# Patient Record
Sex: Male | Born: 1995 | Hispanic: Yes | Marital: Single | Smoking: Current some day smoker
Health system: Southern US, Community
[De-identification: ages and names within clinical notes are randomized; demographics above are authoritative.]

---
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
## 2017-03-12 ENCOUNTER — Ambulatory Visit (INDEPENDENT_AMBULATORY_CARE_PROVIDER_SITE_OTHER): Payer: BLUE CROSS/BLUE SHIELD | Admitting: Physician Assistant

## 2017-03-12 VITALS — BP 132/72 | HR 102 | Temp 101.7°F | Resp 16 | Ht 68.0 in | Wt 207.0 lb

## 2017-03-12 DIAGNOSIS — J029 Acute pharyngitis, unspecified: Secondary | ICD-10-CM | POA: Diagnosis not present

## 2017-03-12 LAB — POCT RAPID STREP A (OFFICE): RAPID STREP A SCREEN: NEGATIVE

## 2017-03-12 MED ORDER — AMOXICILLIN-POT CLAVULANATE 875-125 MG PO TABS: 1.0000 | ORAL_TABLET | Freq: Two times a day (BID) | ORAL | 0 refills | Status: AC

## 2017-03-12 NOTE — Progress Notes (Signed)
PRIMARY CARE AT [facility] [address], [place] [address] [phone]  Date:  03/12/2017   Name:  Jorge Keith   DOB:  03/18/1996   MRN:  [id number]  PCP:  System, Pcp Not In    History of Present Illness:  Jorge Keith is a 21 y.o. male patient who presents to PCP with  Chief Complaint  Patient presents with  . Sore Throat    x 1 day  . Back Pain    x 1 day  . Fever    101.7     He has throat pain this morning.  Yesterday, he had sore gums and lower back.   Last ibuprofen 11am.   He has no coughing at this time.   No nasal congestion.   No urinary pain, nausea, hematuria, or frequency.    There are no active problems to display for this patient.   No past medical history on file.  No past surgical history on file.  Social History  Substance Use Topics  . Smoking status: Passive Smoke Exposure - Never Smoker  . Smokeless tobacco: Not on file  . Alcohol use Not on file    No family history on file.  No Known Allergies  Medication list has been reviewed and updated.  No current outpatient prescriptions on file prior to visit.   No current facility-administered medications on file prior to visit.     ROS ROS otherwise unremarkable unless listed above.  Physical Examination: BP 132/72   Pulse (!) 102   Temp (!) 101.7 F (38.7 C) (Oral)   Resp 16   Ht  (1.727 m)   Wt 207 lb (93.9 kg)   SpO2 99%   BMI 31.47 kg/m  Ideal Body Weight: Weight in (lb) to have BMI = 25: 164.1  Physical Exam  Constitutional: He is oriented to person, place, and time. He appears well-developed and well-nourished. No distress.  HENT:  Head: Normocephalic and atraumatic.  Right Ear: Tympanic membrane, external ear and ear canal normal.  Left Ear: Tympanic membrane, external ear and ear canal normal.  Nose: Mucosal edema present.  Mouth/Throat: Uvula is midline. Posterior oropharyngeal erythema present. No oropharyngeal exudate or posterior oropharyngeal edema.  Eyes:  Pupils are equal, round, and reactive to light. Conjunctivae and EOM are normal.  Cardiovascular: Normal rate.   Pulmonary/Chest: Effort normal and breath sounds normal. No accessory muscle usage. No apnea and no tachypnea. No respiratory distress. He has no decreased breath sounds. He has no wheezes. He has no rhonchi.  Neurological: He is alert and oriented to person, place, and time.  Skin: Skin is warm and dry. He is not diaphoretic.  Psychiatric: He has a normal mood and affect. His behavior is normal.   Results for orders placed or performed in visit on 03/12/17  POCT rapid strep A  Result Value Ref Range   Rapid Strep A Screen Negative Negative     Assessment and Plan: Jorge Keith is a 21 y.o. male who is here today for cc of  Chief Complaint  Patient presents with  . Sore Throat    x 1 day  . Back Pain    x 1 day  . Fever    101.7   Throat infection  Sore throat - Plan: POCT rapid strep A  Trena Platt, PA-C [facility] 10/9/20188:23 AM

## 2017-03-12 NOTE — Patient Instructions (Addendum)
Please use the ibuprofen  every 6 hours and take with foods. You can try cepacol lozenges for the throat pain as well.    Sore Throat When you have a sore throat, your throat may:  Hurt.  Burn.  Feel irritated.  Feel scratchy.  Many things can cause a sore throat, including:  An infection.  Allergies.  Dryness in the air.  Smoke or pollution.  Gastroesophageal reflux disease (GERD).  A tumor.  A sore throat can be the first sign of another sickness. It can happen with other problems, like coughing or a fever. Most sore throats go away without treatment. Follow these instructions at home:  Take over-the-counter medicines only as told by your doctor.  Drink enough fluids to keep your pee (urine) clear or pale yellow.  Rest when you feel you need to.  To help with pain, try: ? Sipping warm liquids, such as broth, herbal tea, or warm water. ? Eating or drinking cold or frozen liquids, such as frozen ice pops. ? Gargling with a salt-water mixture 3-4 times a day or as needed. To make a salt-water mixture, add -1 tsp of salt in 1 cup of warm water. Mix it until you cannot see the salt anymore. ? Sucking on hard candy or throat lozenges. ? Putting a cool-mist humidifier in your bedroom at night. ? Sitting in the bathroom with the door closed for 5-10 minutes while you run hot water in the shower.  Do not use any tobacco products, such as cigarettes, chewing tobacco, and e-cigarettes. If you need help quitting, ask your doctor. Contact a doctor if:  You have a fever for more than 2-3 days.  You keep having symptoms for more than 2-3 days.  Your throat does not get better in 7 days.  You have a fever and your symptoms suddenly get worse. Get help right away if:  You have trouble breathing.  You cannot swallow fluids, soft foods, or your saliva.  You have swelling in your throat or neck that gets worse.  You keep feeling like you are going to throw up  (vomit).  You keep throwing up. This information is not intended to replace advice given to you by your health care provider. Make sure you discuss any questions you have with your health care provider. Document Released: 03/04/2008 Document Revised: 01/20/2016 Document Reviewed: 03/16/2015 Elsevier Interactive Patient Education  2018 [employer].   IF you received an x-ray today, you will receive an invoice from [facility]. Please contact [facility] at [phone] with questions or concerns regarding your invoice.   IF you received labwork today, you will receive an invoice from [facility]. Please contact [facility] at [phone] with questions or concerns regarding your invoice.   Our billing staff will not be able to assist you with questions regarding bills from these companies.  You will be contacted with the lab results as soon as they are available. The fastest way to get your results is to activate your My Chart account. Instructions are located on the last page of this paperwork. If you have not heard from [place] regarding the results in 2 weeks, please contact this office.    '

## 2017-03-13 ENCOUNTER — Emergency Department (HOSPITAL_COMMUNITY)
Admission: EM | Admit: 2017-03-13 | Discharge: 2017-03-14 | Disposition: A | Discharge status: Home / Self Care | Payer: BLUE CROSS/BLUE SHIELD | Attending: Physician Assistant | Admitting: Physician Assistant

## 2017-03-13 ENCOUNTER — Encounter (HOSPITAL_COMMUNITY): Payer: Self-pay | Admitting: Emergency Medicine

## 2017-03-13 DIAGNOSIS — F17200 Nicotine dependence, unspecified, uncomplicated: Secondary | ICD-10-CM | POA: Insufficient documentation

## 2017-03-13 DIAGNOSIS — J029 Acute pharyngitis, unspecified: Secondary | ICD-10-CM | POA: Insufficient documentation

## 2017-03-13 LAB — RAPID STREP SCREEN (MED CTR MEBANE ONLY): Streptococcus, Group A Screen (Direct): NEGATIVE

## 2017-03-13 NOTE — ED Triage Notes (Signed)
Reports fever and sore throat that started yesterday.  Seen by PCP and given amoxicillin.  Tested for strep which was negative and started on antibiotic.  Here because he's not feeling any better.

## 2017-03-13 NOTE — ED Provider Notes (Signed)
[facility]-EMERGENCY DEPT Provider Note   CSN: [id number] Arrival date & time: 03/13/17  2309     History   Chief Complaint Chief Complaint  Patient presents with  . Sore Throat  . Fever    HPI Jorge Keith is a 21 y.o. male.  HPI  21 y.o. male , presents to the Emergency Department today due to sore throat and fever that started yesterday. Noted seeing PCP and given Rx Amoxicillin. Pt states that her strep was negative yesterday. Pt is here because they are not feeling any better. Pt mother Googled white spots on back of throat and thought they were "tonsil stones." She came to ED for this. No N/V/D. No new onset of symptoms. Pt tolerating PO. Rates pain 5/10. Dull ache. Pt compliant with ABX. No other symptoms noted.    History reviewed. No pertinent past medical history.  There are no active problems to display for this patient.   History reviewed. No pertinent surgical history.     Home Medications    Prior to Admission medications   Medication Sig Start Date End Date Taking? Authorizing Provider  amoxicillin-clavulanate (AUGMENTIN) 875-125 MG tablet Take 1 tablet by mouth 2 (two) times daily. 03/12/17 03/22/17  Garnetta Buddy, PA    Family History No family history on file.  Social History Social History  Substance Use Topics  . Smoking status: Current Some Day Smoker  . Smokeless tobacco: Never Used  . Alcohol use Yes     Comment: occasionally     Allergies   Patient has no known allergies.   Review of Systems Review of Systems ROS reviewed and all are negative for acute change except as noted in the HPI.  Physical Exam Updated Vital Signs BP 120/61 (BP Location: Right Arm)   Pulse 88   Temp 98.2 F (36.8 C) (Oral)   Resp 20   Ht  (1.727 m)   Wt 93.9 kg (207 lb)   SpO2 99%   BMI 31.47 kg/m   Physical Exam  Constitutional: He is oriented to person, place, and time. He appears well-developed and well-nourished. No distress.  HENT:    Head: Normocephalic and atraumatic.  Right Ear: Tympanic membrane, external ear and ear canal normal.  Left Ear: Tympanic membrane, external ear and ear canal normal.  Nose: Nose normal.  Mouth/Throat: Uvula is midline, oropharynx is clear and moist and mucous membranes are normal. No trismus in the jaw. No oropharyngeal exudate, posterior oropharyngeal erythema or tonsillar abscesses.  Eyes: Pupils are equal, round, and reactive to light. EOM are normal.  Neck: Normal range of motion. Neck supple. No tracheal deviation present.  Cardiovascular: Normal rate, regular rhythm, S1 normal, S2 normal, normal heart sounds, intact distal pulses and normal pulses.   Pulmonary/Chest: Effort normal and breath sounds normal. No respiratory distress. He has no decreased breath sounds. He has no wheezes. He has no rhonchi. He has no rales.  Abdominal: Normal appearance and bowel sounds are normal. There is no tenderness.  Musculoskeletal: Normal range of motion.  Neurological: He is alert and oriented to person, place, and time.  Skin: Skin is warm and dry.  Psychiatric: He has a normal mood and affect. His speech is normal and behavior is normal. Thought content normal.     ED Treatments / Results  Labs (all labs ordered are listed, but only abnormal results are displayed) Labs Reviewed  RAPID STREP SCREEN (NOT AT [facility])    EKG  EKG Interpretation None  Radiology No results found.  Procedures Procedures (including critical care time)  Medications Ordered in ED Medications - No data to display   Initial Impression / Assessment and Plan / ED Course  I have reviewed the triage vital signs and the nursing notes.  Pertinent labs & imaging results that were available during my care of the patient were reviewed by me and considered in my medical decision making (see chart for details).  Final Clinical Impressions(s) / ED Diagnoses  {I have reviewed and evaluated the relevant  laboratory values.   {I have reviewed the relevant previous healthcare records.  {I obtained HPI from historian.   ED Course:  Assessment: Pt is a 21 y.o. male presents to the Emergency Department today due to sore throat and fever that started yesterday. Noted seeing PCP and given Rx Amoxicillin. Pt states that her strep was negative yesterday. Pt is here because they are not feeling any better. Pt mother Googled white spots on back of throat and thought they were "tonsil stones." She came to ED for this. No N/V/D. No new onset of symptoms. Pt tolerating PO. Rates pain 5/10. Dull ache. Pt compliant with ABX. Marland Kitchen On exam, pt in NAD. Nontoxic/nonseptic appearing. VSS. Afebrile. Lungs CTA. Heart RRR. HEENT exam with exudate/erythema. No abcess. Plan is to DC home with follow up to PCP. Pt with ABX already. At time of discharge, Patient is in no acute distress. Vital Signs are stable. Patient is able to ambulate. Patient able to tolerate PO.   Disposition/Plan:  DC Home Additional Verbal discharge instructions given and discussed with patient.  Pt Instructed to f/u with PCP in the next week for evaluation and treatment of symptoms. Return precautions given Pt acknowledges and agrees with plan  Supervising Physician Mackuen, Courteney Lyn, *  Final diagnoses:  Pharyngitis, unspecified etiology    New Prescriptions New Prescriptions   No medications on file     Audry Pili, Cordelia Poche 03/14/17 0012    Abelino Derrick, MD 03/14/17 1538

## 2017-03-14 NOTE — Discharge Instructions (Signed)
Please read and follow all provided instructions.  Your diagnoses today include:  1. Pharyngitis, unspecified etiology     Tests performed today include: Vital signs. See below for your results today.   Medications prescribed:  Take as prescribed   Home care instructions:  Follow any educational materials contained in this packet.  Follow-up instructions: Please follow-up with your primary care provider for further evaluation of symptoms and treatment   Return instructions:  Please return to the Emergency Department if you do not get better, if you get worse, or new symptoms OR  - Fever (temperature greater than 101.21F)  - Bleeding that does not stop with holding pressure to the area    -Severe pain (please note that you may be more sore the day after your accident)  - Chest Pain  - Difficulty breathing  - Severe nausea or vomiting  - Inability to tolerate food and liquids  - Passing out  - Skin becoming red around your wounds  - Change in mental status (confusion or lethargy)  - New numbness or weakness    Please return if you have any other emergent concerns.  Additional Information:  Your vital signs today were: BP 120/61 (BP Location: Right Arm)    Pulse 88    Temp 98.2 F (36.8 C) (Oral)    Resp 20    Ht  (1.727 m)    Wt 93.9 kg (207 lb)    SpO2 99%    BMI 31.47 kg/m  If your blood pressure (BP) was elevated above 135/85 this visit, please have this repeated by your doctor within one month. ---------------

## 2017-03-16 LAB — CULTURE, GROUP A STREP (THRC)

## 2017-03-17 ENCOUNTER — Encounter: Payer: Self-pay | Admitting: Physician Assistant

## 2017-05-12 IMAGING — CT CT MAXILLOFACIAL W/O CM
1 series · 16 of 30 positions shown, 20 images · non-contrast
Comparison: None.

CLINICAL DATA: 19-year-old male with acute right facial pain
following football injury today. Initial encounter.

EXAM:
CT MAXILLOFACIAL WITHOUT CONTRAST
TECHNIQUE: Multidetector CT imaging of the maxillofacial structures was
performed. Multiplanar CT image reconstructions were also generated.
A small metallic BB was placed on the right temple in order to
reliably differentiate right from left.

[Series 3: maxillofacial 2.0 h30s st · axial · 0.34mm/px · z∈[-271,-109]mm · 16 of 87 slices shown, 20 images]
[im 3/87  brain]
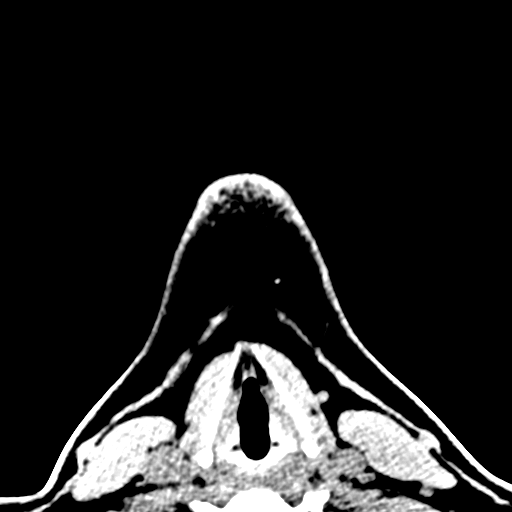
[im 3/87  bone]
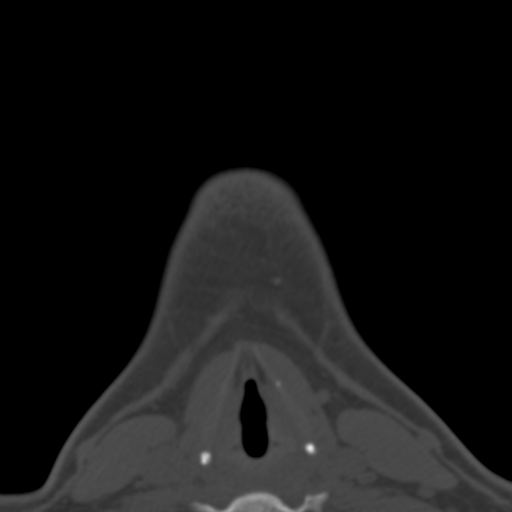
[im 9/87  bone]
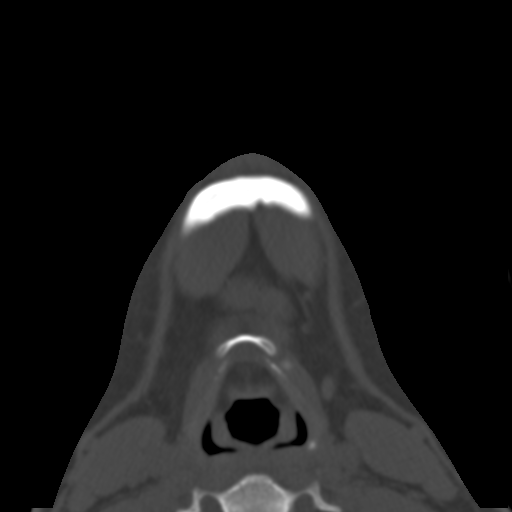
[im 15/87  bone]
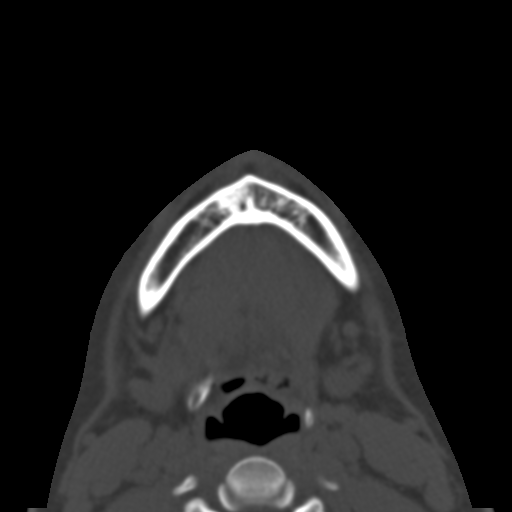
[im 21/87  bone]
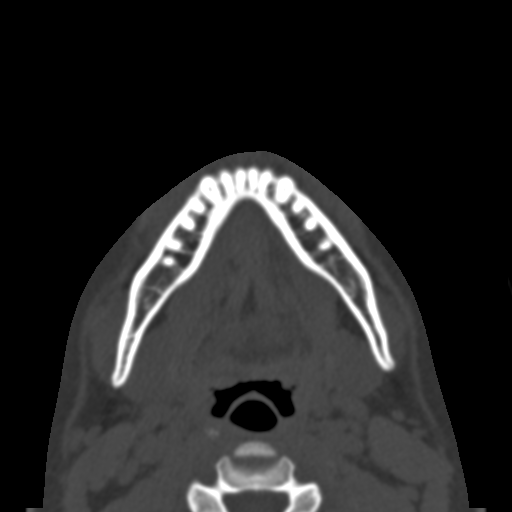
[im 24/87  brain]
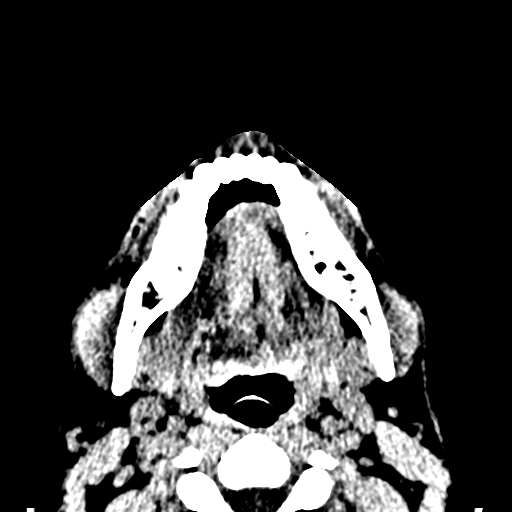
[im 24/87  bone]
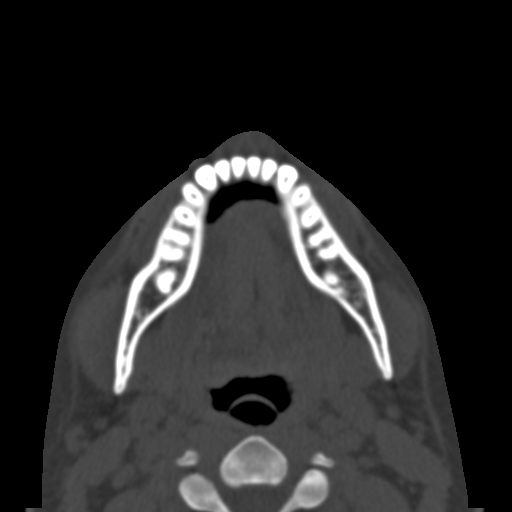
[im 30/87  bone]
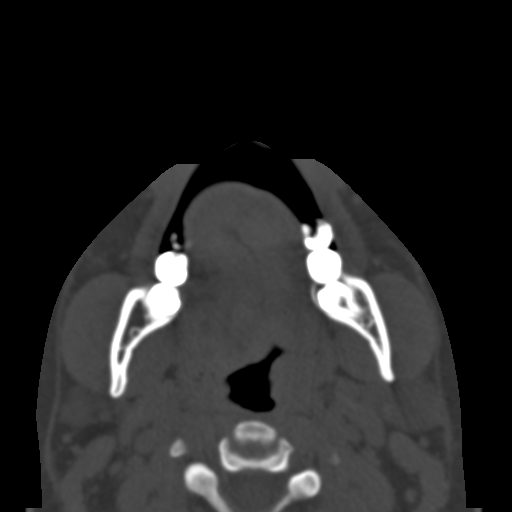
[im 36/87  bone]
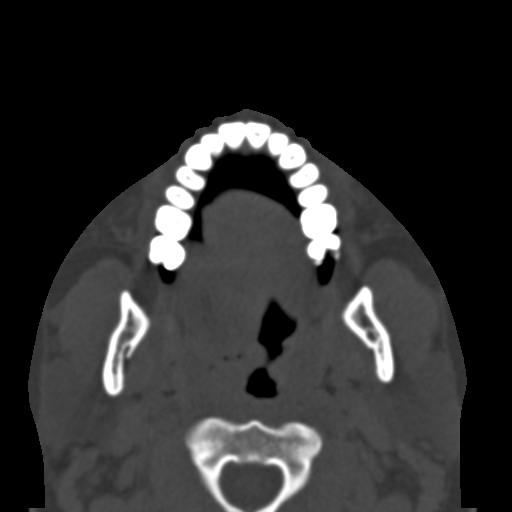
[im 42/87  bone]
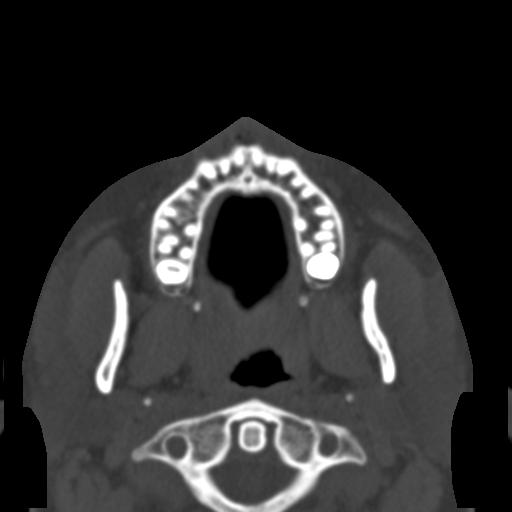
[im 45/87  brain]
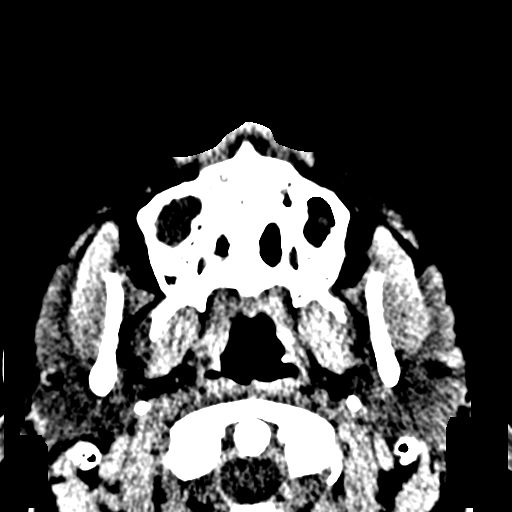
[im 45/87  bone]
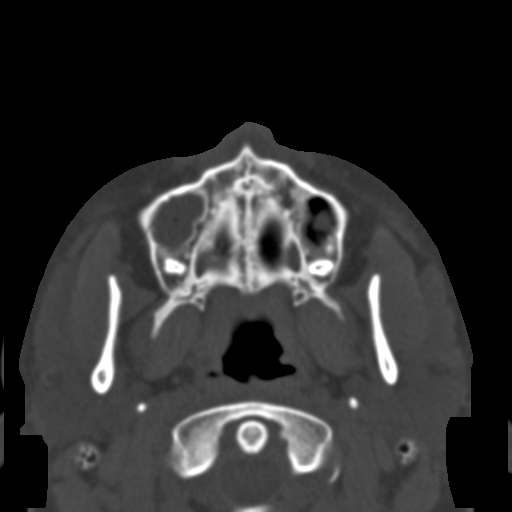
[im 51/87  bone]
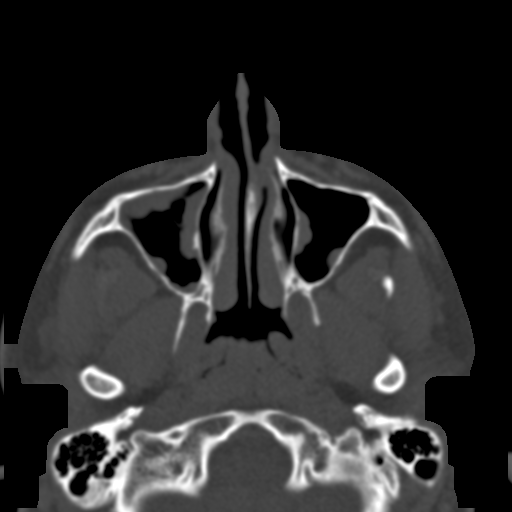
[im 57/87  bone]
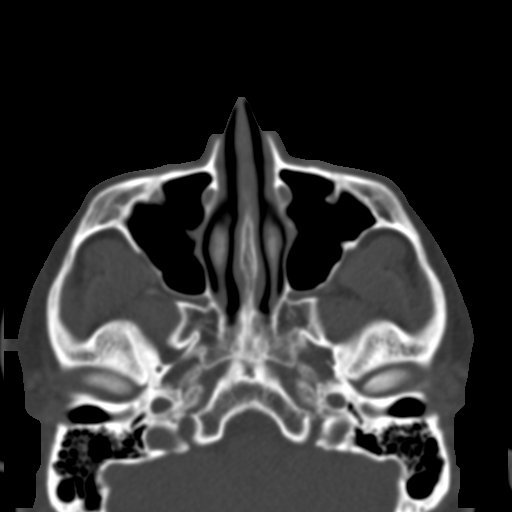
[im 63/87  bone]
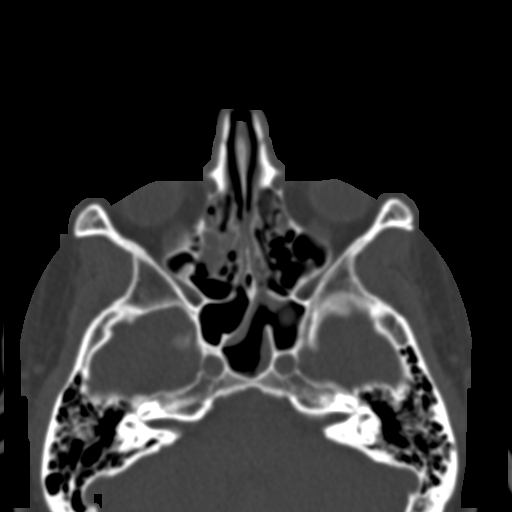
[im 66/87  brain]
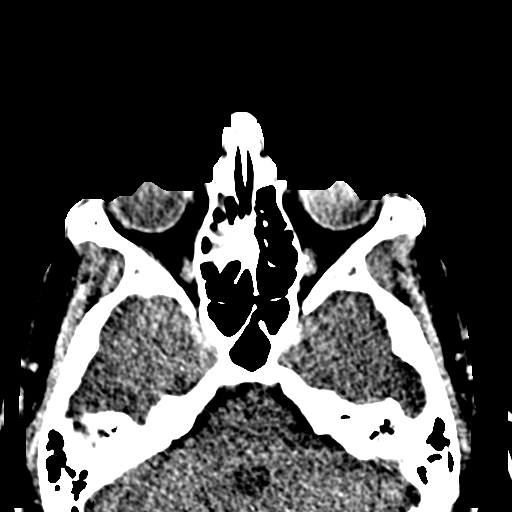
[im 66/87  bone]
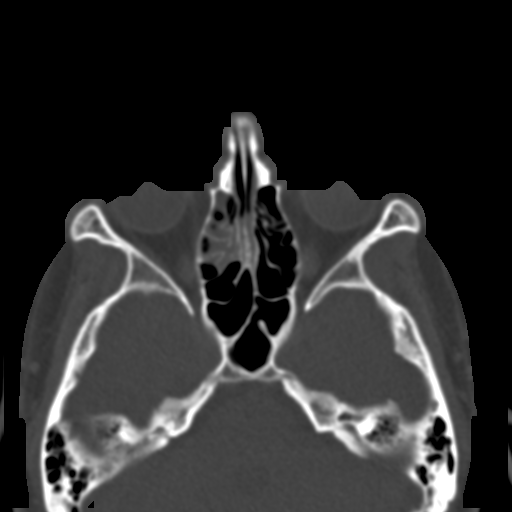
[im 72/87  bone]
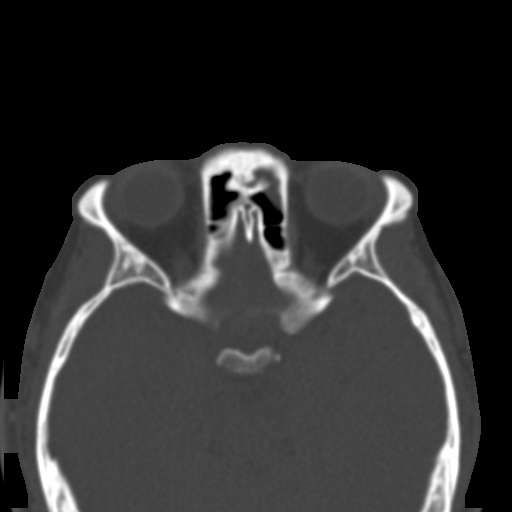
[im 78/87  bone]
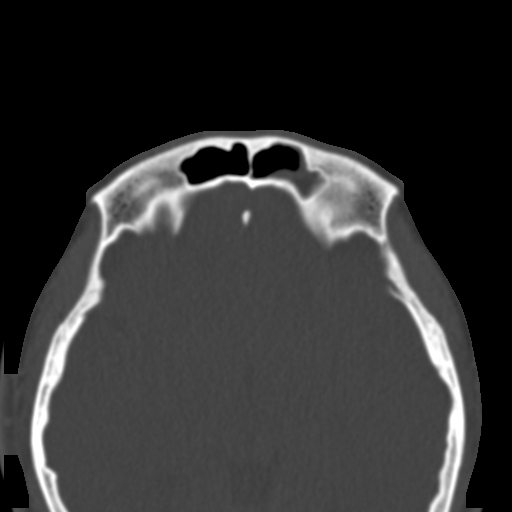
[im 84/87  bone]
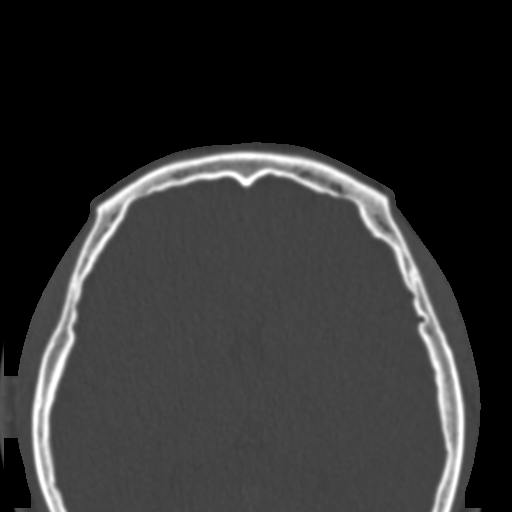

[16 of 30 positions shown; findings below may reference images not displayed]

FINDINGS: There is no evidence of acute fracture, subluxation or dislocation.

Mucosal thickening and scattered paranasal sinuses noted. No
air-fluid levels are identified.

The mastoid air cells and middle/inner ears are clear.

The orbits and globes are unremarkable.

The soft tissue structures are unremarkable.
IMPRESSION: No evidence of acute bony abnormality.

Mucosal thickening within scattered paranasal sinuses compatible
with chronic sinusitis.

## 2017-09-09 ENCOUNTER — Encounter: Payer: Self-pay | Admitting: Physician Assistant
